# Patient Record
Sex: Female | Born: 1935 | Race: White | Hispanic: No
Health system: Southern US, Community
[De-identification: ages and names within clinical notes are randomized; demographics above are authoritative.]

## PROBLEM LIST (undated history)

## (undated) DIAGNOSIS — E7800 Pure hypercholesterolemia, unspecified: Secondary | ICD-10-CM

## (undated) DIAGNOSIS — E079 Disorder of thyroid, unspecified: Secondary | ICD-10-CM

## (undated) DIAGNOSIS — I10 Essential (primary) hypertension: Secondary | ICD-10-CM

## (undated) HISTORY — DX: Disorder of thyroid, unspecified: E07.9

## (undated) HISTORY — PX: PACEMAKER INSERTION: SHX728

## (undated) HISTORY — DX: Pure hypercholesterolemia, unspecified: E78.00

## (undated) HISTORY — DX: Essential (primary) hypertension: I10

---

## 2021-02-12 ENCOUNTER — Emergency Department (HOSPITAL_BASED_OUTPATIENT_CLINIC_OR_DEPARTMENT_OTHER)
Admission: EM | Admit: 2021-02-12 | Discharge: 2021-02-12 | Disposition: A | Discharge status: Home / Self Care | Payer: Medicare Other | Attending: Emergency Medicine | Admitting: Emergency Medicine

## 2021-02-12 ENCOUNTER — Encounter (HOSPITAL_BASED_OUTPATIENT_CLINIC_OR_DEPARTMENT_OTHER): Payer: Self-pay

## 2021-02-12 ENCOUNTER — Emergency Department (HOSPITAL_BASED_OUTPATIENT_CLINIC_OR_DEPARTMENT_OTHER): Payer: Medicare Other

## 2021-02-12 DIAGNOSIS — S42034A Nondisplaced fracture of lateral end of right clavicle, initial encounter for closed fracture: Secondary | ICD-10-CM | POA: Insufficient documentation

## 2021-02-12 DIAGNOSIS — I10 Essential (primary) hypertension: Secondary | ICD-10-CM | POA: Insufficient documentation

## 2021-02-12 DIAGNOSIS — Y92194 Driveway of other specified residential institution as the place of occurrence of the external cause: Secondary | ICD-10-CM | POA: Insufficient documentation

## 2021-02-12 DIAGNOSIS — Y9301 Activity, walking, marching and hiking: Secondary | ICD-10-CM | POA: Diagnosis not present

## 2021-02-12 DIAGNOSIS — S0083XA Contusion of other part of head, initial encounter: Secondary | ICD-10-CM | POA: Insufficient documentation

## 2021-02-12 DIAGNOSIS — S0011XA Contusion of right eyelid and periocular area, initial encounter: Secondary | ICD-10-CM

## 2021-02-12 DIAGNOSIS — Z79899 Other long term (current) drug therapy: Secondary | ICD-10-CM | POA: Insufficient documentation

## 2021-02-12 DIAGNOSIS — W01198A Fall on same level from slipping, tripping and stumbling with subsequent striking against other object, initial encounter: Secondary | ICD-10-CM | POA: Diagnosis not present

## 2021-02-12 DIAGNOSIS — S4991XA Unspecified injury of right shoulder and upper arm, initial encounter: Secondary | ICD-10-CM | POA: Diagnosis present

## 2021-02-12 DIAGNOSIS — W19XXXA Unspecified fall, initial encounter: Secondary | ICD-10-CM

## 2021-02-12 NOTE — Discharge Instructions (Addendum)
You can continue using Tylenol as needed at home for pain.  He can also apply ice packs 10 minutes at a time under shoulder and head for the bruising.  You can put aloe cream on your forehead wound.  Try to avoid a lot of direct sunlight or ultraviolet light until the wound is healed.  This will very likely still form a scar, but avoiding sunlight can help reduce the scar.  The bruising around your eye and forehead may take 2 weeks to go away

## 2021-02-12 NOTE — ED Notes (Signed)
Patient transported to CT 

## 2021-02-12 NOTE — ED Notes (Signed)
Discharge instructions reviewed with patient and daughter. Instructed on follow up information and how to put on sling. No further questions at this time. Unable to sign pad d/t malfunction of equipment. Verbalized understanding.

## 2021-02-12 NOTE — ED Provider Notes (Signed)
[facility] EMERGENCY DEPARTMENT Provider Note   CSN: [id number] Arrival date & time: 02/12/21  4098     History Chief Complaint  Patient presents with   Marletta Lor    Lindsey Orr is a 87 y.o. female presenting to the ED with a fall and injury to the right side of her body.  She reports that she was bending over to pick up a flower today on the driveway, and lost her balance and fell onto her right side.  She does not recall what exactly she hit her head on.  She does have an abrasion to the right forehead, as well as swelling and bruising around her right eye.  She also had pain in her right shoulder.  She denies any hip pain or injuries anywhere else.  She is not on blood thinners.  She is present with her daughter at bedside.  HPI     Past Medical History:  Diagnosis Date   High cholesterol    Hypertension    Thyroid disease     There are no problems to display for this patient.    OB History   No obstetric history on file.     History reviewed. No pertinent family history.     Home Medications Prior to Admission medications   Medication Sig Start Date End Date Taking? Authorizing Provider  EPINEPHrine (EPIPEN JR) 0.15 MG/0.3ML injection INJECT 0.3ML INTO THE MUSCLE ONCE FOR 1 DOSE. 05/12/15  Yes [provider]  albuterol (VENTOLIN HFA) 108 (90 Base) MCG/ACT inhaler Inhale 2 puffs into the lungs every 4 (four) hours as needed. 08/24/20   [provider]  amLODipine (NORVASC) 2.5 MG tablet Take 2.5 mg by mouth daily. 02/04/21   [provider]  ascorbic acid (VITAMIN C) 1000 MG tablet Take by mouth.    [provider]  famotidine (PEPCID) 40 MG tablet Take 40 mg by mouth daily. 02/03/21   [provider]  fexofenadine (ALLEGRA) 180 MG tablet Take by mouth.    [provider]  levothyroxine (SYNTHROID) 88 MCG tablet Take 88 mcg by mouth daily. 12/13/20   [provider]  Multiple Vitamin (MULTI-VITAMIN)  tablet Take by mouth.    [provider]  simvastatin (ZOCOR) 20 MG tablet Take 20 mg by mouth at bedtime. 12/13/20   [provider]    Allergies    Fish allergy, Shellfish allergy, Apixaban, and Rivaroxaban  Review of Systems   Review of Systems  Constitutional:  Negative for chills and fever.  Eyes:  Negative for photophobia, pain and visual disturbance.  Respiratory:  Negative for cough and shortness of breath.   Cardiovascular:  Negative for chest pain and palpitations.  Gastrointestinal:  Negative for abdominal pain and vomiting.  Genitourinary:  Negative for dysuria and hematuria.  Musculoskeletal:  Positive for arthralgias and myalgias.  Skin:  Positive for rash and wound.  Neurological:  Positive for headaches. Negative for syncope.  All other systems reviewed and are negative.  Physical Exam Updated Vital Signs BP (!) 147/74   Pulse 64   Temp 98 F (36.7 C) (Oral)   Resp 20   Ht 5\' 5"  (1.651 m)   Wt 78.2 kg   SpO2 97%   BMI 28.71 kg/m   Physical Exam Constitutional:      General: She is not in acute distress. HENT:     Head: Normocephalic and atraumatic.     Comments: Large superficial abrasion to right forehead Right periorbital edema  and ecchymosis  Eyes:     Conjunctiva/sclera: Conjunctivae normal.     Pupils: Pupils are equal, round, and reactive to light.  Cardiovascular:     Rate and Rhythm: Normal rate and regular rhythm.  Pulmonary:     Effort: Pulmonary effort is normal. No respiratory distress.  Abdominal:     General: There is no distension.     Tenderness: There is no abdominal tenderness.  Musculoskeletal:     Comments: Ecchymosis of the right shoulder near humeral head, minimal tenderness No deformity of the arm or clavicle No other injuries noted on exam  Skin:    General: Skin is warm and dry.  Neurological:     General: No focal deficit present.     Mental Status: She is alert and oriented to person, place, and  time. Mental status is at baseline.  Psychiatric:        Mood and Affect: Mood normal.        Behavior: Behavior normal.    ED Results / Procedures / Treatments   Labs (all labs ordered are listed, but only abnormal results are displayed) Labs Reviewed - No data to display  EKG None  Radiology DG Shoulder Right  Result Date: 02/12/2021 CLINICAL DATA:  Fall. EXAM: RIGHT SHOULDER - 2+ VIEW COMPARISON:  12/29/2019. FINDINGS: Slightly displaced fracture of the distal aspect of the right clavicle. No evidence of dislocation or separation. Radiopacity noted over the right shoulder possibly loose body. Similar finding noted on prior study. Punctate pulmonary calcifications consistent with prior granulomas disease. IMPRESSION: Slightly displaced fracture of the distal aspect of the right clavicle. No evidence of dislocation or separation Electronically Signed   By: Maisie Fus  Register   On: 02/12/2021 11:07   CT Head Wo Contrast  Result Date: 02/12/2021 CLINICAL DATA:  Fall onto concrete, head injury EXAM: CT HEAD WITHOUT CONTRAST CT MAXILLOFACIAL WITHOUT CONTRAST TECHNIQUE: Multidetector CT imaging of the head and maxillofacial structures were performed using the standard protocol without intravenous contrast. Multiplanar CT image reconstructions of the maxillofacial structures were also generated. COMPARISON:  CT head January 2021 FINDINGS: CT HEAD FINDINGS Brain: There is no acute intracranial hemorrhage, mass effect, or edema. Gray-white differentiation is preserved. No extra-axial collection. Ventricles and sulci are within normal limits in size and configuration. Patchy low-attenuation in the supratentorial white matter is nonspecific but may reflect mild to moderate chronic microvascular ischemic changes. Vascular: There is intracranial atherosclerotic calcification at the skull base. Skull: Calvarium is intact. Other: Mastoid air cells are clear.  Probable right stapes implant. CT MAXILLOFACIAL  FINDINGS Osseous: No acute facial fracture. Degenerative changes of the right temporomandibular joint Orbits: No intraorbital hematoma. Sinuses: Paranasal sinus mucosal thickening. Soft tissues: Right anterior frontal scalp and periorbital soft tissue swelling. IMPRESSION: No evidence of acute intracranial injury.  No acute facial fracture. Electronically Signed   By: Guadlupe Spanish M.D.   On: 02/12/2021 11:10   CT Cervical Spine Wo Contrast  Result Date: 02/12/2021 CLINICAL DATA:  Fall, head injury EXAM: CT CERVICAL SPINE WITHOUT CONTRAST TECHNIQUE: Multidetector CT imaging of the cervical spine was performed without intravenous contrast. Multiplanar CT image reconstructions were also generated. COMPARISON:  None. FINDINGS: Alignment: Trace anterolisthesis at C4-C5. Skull base and vertebrae: No acute cervical spine fracture. Vertebral body heights are maintained. Soft tissues and spinal canal: No prevertebral fluid or swelling. No visible canal hematoma. Disc levels: Multilevel degenerative changes are present including disc space narrowing, endplate osteophytes, and facet and uncovertebral hypertrophy. There is  no high-grade osseous encroachment on the spinal canal. Upper chest: Included lung apices are clear. Other: Calcified plaque at the common carotid bifurcations. There is a 13 mm soft tissue lesion of the left parotid. IMPRESSION: No acute cervical spine fracture. 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 Consider tissue sampling as appropriate in this patient. Electronically Signed   By: Guadlupe SpanishPraneil  Patel M.D.   On: 02/12/2021 11:18   DG Humerus Right  Result Date: 02/12/2021 CLINICAL DATA:  Recent fall with right arm pain, initial encounter EXAM: RIGHT HUMERUS - 2+ VIEW COMPARISON:  None. FINDINGS: Mild degenerative changes of the acromioclavicular joint are seen. No definitive humeral fracture is seen. Underlying bony thorax appears within normal limits. Distal right  clavicular fracture is noted. IMPRESSION: Distal right clavicular fracture.  No acute abnormality is seen. Electronically Signed   By: Alcide CleverMark  Lukens M.D.   On: 02/12/2021 11:08   CT Maxillofacial Wo Contrast  Result Date: 02/12/2021 CLINICAL DATA:  Fall onto concrete, head injury EXAM: CT HEAD WITHOUT CONTRAST CT MAXILLOFACIAL WITHOUT CONTRAST TECHNIQUE: Multidetector CT imaging of the head and maxillofacial structures were performed using the standard protocol without intravenous contrast. Multiplanar CT image reconstructions of the maxillofacial structures were also generated. COMPARISON:  CT head January 2021 FINDINGS: CT HEAD FINDINGS Brain: There is no acute intracranial hemorrhage, mass effect, or edema. Gray-white differentiation is preserved. No extra-axial collection. Ventricles and sulci are within normal limits in size and configuration. Patchy low-attenuation in the supratentorial white matter is nonspecific but may reflect mild to moderate chronic microvascular ischemic changes. Vascular: There is intracranial atherosclerotic calcification at the skull base. Skull: Calvarium is intact. Other: Mastoid air cells are clear.  Probable right stapes implant. CT MAXILLOFACIAL FINDINGS Osseous: No acute facial fracture. Degenerative changes of the right temporomandibular joint Orbits: No intraorbital hematoma. Sinuses: Paranasal sinus mucosal thickening. Soft tissues: Right anterior frontal scalp and periorbital soft tissue swelling. IMPRESSION: No evidence of acute intracranial injury.  No acute facial fracture. Electronically Signed   By: Guadlupe SpanishPraneil  Patel M.D.   On: 02/12/2021 11:10    Procedures Procedures   Medications Ordered in ED Medications - No data to display  ED Course  I have reviewed the triage vital signs and the nursing notes.  Pertinent labs & imaging results that were available during my care of the patient were reviewed by me and considered in my medical decision making (see chart  for details).  This is an 87 year old female presenting with a mechanical fall at home and bruising and injuries to the right side of the body.  CT scans of been ordered of the head, facial bone scan, C-spine.  Also get x-rays of the right shoulder.  No other injuries are noted on exam.  She is otherwise well-appearing.  Family is at the bedside to provide supplemental history.  Trauma images reviewed.  No acute intracranial injury, C-spine fracture, or facial fracture.  She does appear to have a right clavicle fracture.  This is a closed fracture.  There is no tenting of the skin.  She is neurovascularly intact.  We will place her in a sling and have her follow-up with orthopedics in 2 to 3 weeks.  She and her daughter verbalized understanding.     Final Clinical Impression(s) / ED Diagnoses Final diagnoses:  Closed nondisplaced fracture of acromial end of right clavicle, initial encounter  Contusion of face, initial encounter  Fall, initial encounter  Periorbital ecchymosis of right eye,  initial encounter    Rx / DC Orders ED Discharge Orders     None        Azara Gemme, Kermit Balo, MD 02/12/21 1428

## 2021-02-12 NOTE — ED Triage Notes (Signed)
Pt fell yesterday walking up driveway, fell on concrete steps. Bruising/swelling to right forehead and eye as well as right shoulder. Denies blood thinners.

## 2021-02-27 ENCOUNTER — Ambulatory Visit: Payer: Medicare Other | Admitting: Orthopaedic Surgery

## 2021-02-28 ENCOUNTER — Ambulatory Visit: Payer: Self-pay

## 2021-02-28 ENCOUNTER — Other Ambulatory Visit: Payer: Self-pay

## 2021-02-28 ENCOUNTER — Ambulatory Visit: Payer: Medicare Other | Admitting: Orthopaedic Surgery

## 2021-02-28 DIAGNOSIS — S42034A Nondisplaced fracture of lateral end of right clavicle, initial encounter for closed fracture: Secondary | ICD-10-CM | POA: Diagnosis not present

## 2021-02-28 DIAGNOSIS — M79601 Pain in right arm: Secondary | ICD-10-CM | POA: Diagnosis not present

## 2021-02-28 NOTE — Progress Notes (Signed)
The patient is a very pleasant 87 year old female who sustained a mechanical fall 2 weeks ago injuring her right shoulder.  She sustained a lateral clavicle fracture and was placed appropriately in a sling in the emergency room.  She is seeing [place] today as routine follow-up.  She reports minimal pain.  She is been compliant with wearing her sling and coming in and out of it for hygiene purposes.  Her daughter is with her said there was a lot of bruising around the clavicle for the first week but that is getting significantly less.  She denies any neck pain denies any numbness and tingling in her hands.  She is had no other acute change in her medical status.  We are able to review all of her chart through epic in terms of medications as well as past medical history.  There is been no other issues related to her fall.  On examination of her right upper extremity she is able to abduct her shoulder easily and reach above her head with minimal discomfort.  There is no soft tissue compromise when assessing the clavicle.  There is some bruising and slight pain around the lateral clavicle.  X-rays of the right clavicle show a minimally displaced lateral clavicle fracture.  The relationship of the [facility] joint and the scapula with the clavicle is normal.  The shoulder is well located.  At this point since it been 2 weeks and she is comfortable she can stop wearing the sling.  There are really no restrictions for her and she will let her pain be the guide.  They know to avoid heavy lifting until she is more comfortable which she does not do anyway in terms of lifting.  Follow-up really can be as needed since she is doing so well unless there are issues.  I talked to the daughter in length about this and they agreed that follow-up should be as needed unless there are issues.  All questions and concerns were answered and addressed.

## 2023-01-24 IMAGING — CT CT HEAD W/O CM
3 series · 15 of 47 positions shown, 18 images · non-contrast
Comparison: CT head August 2019

CLINICAL DATA: Fall onto concrete, head injury

EXAM:
CT HEAD WITHOUT CONTRAST
CT MAXILLOFACIAL WITHOUT CONTRAST
TECHNIQUE: Multidetector CT imaging of the head and maxillofacial structures
were performed using the standard protocol without intravenous
contrast. Multiplanar CT image reconstructions of the maxillofacial
structures were also generated.

[Series 2: head wo · axial · 0.45mm/px · z∈[-163,-33]mm · 9 of 32 slices shown, 12 images]
[im 3/32  brain]
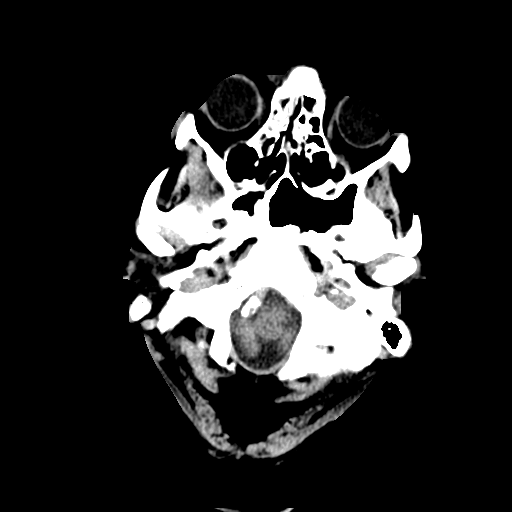
[im 3/32  bone]
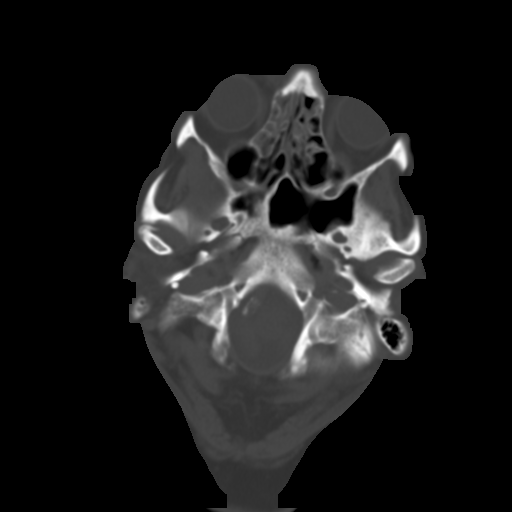
[im 6/32  brain]
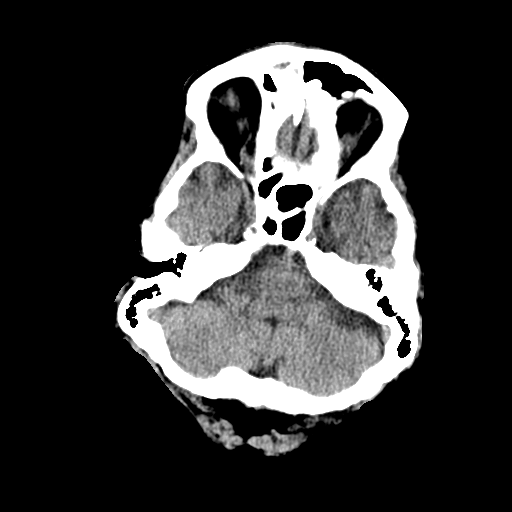
[im 9/32  brain]
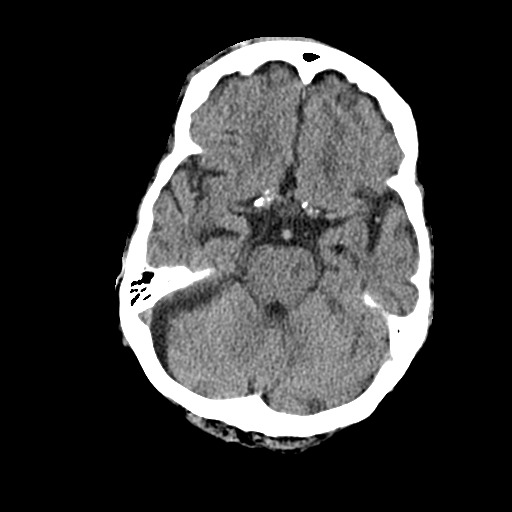
[im 12/32  brain]
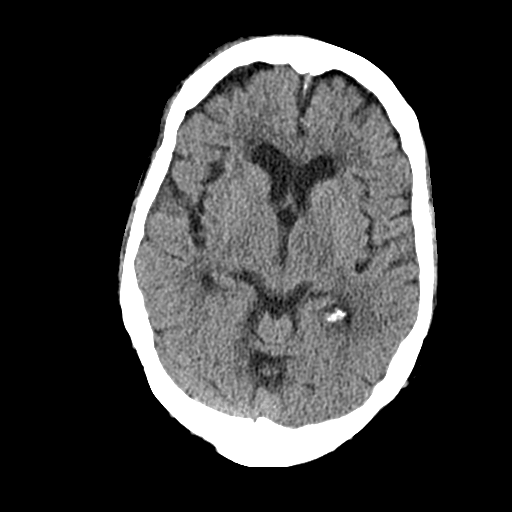
[im 17/32  brain]
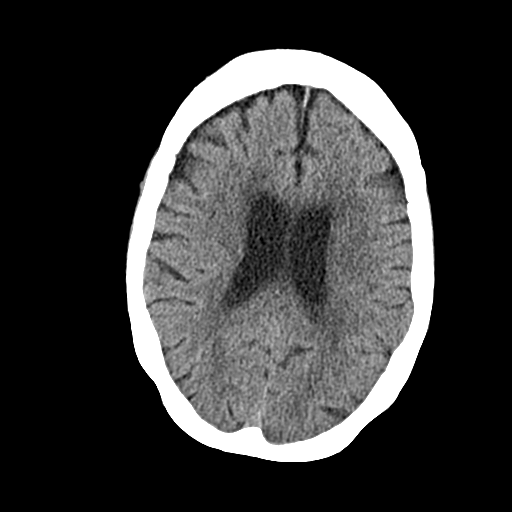
[im 17/32  bone]
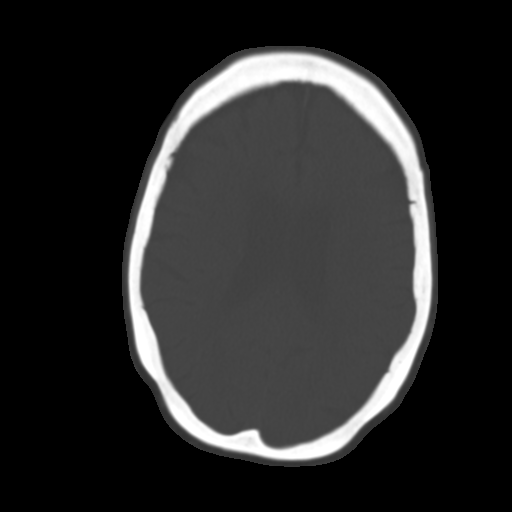
[im 20/32  brain]
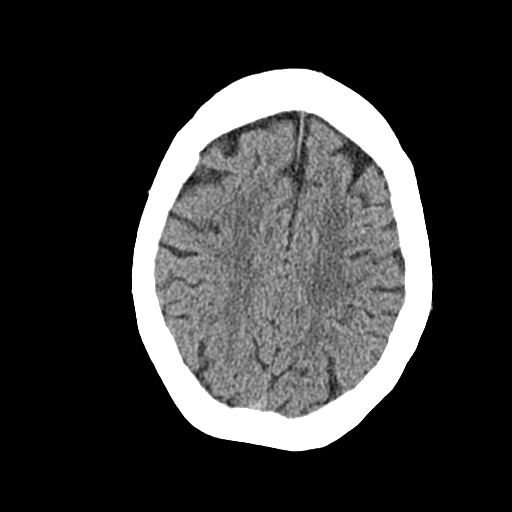
[im 23/32  brain]
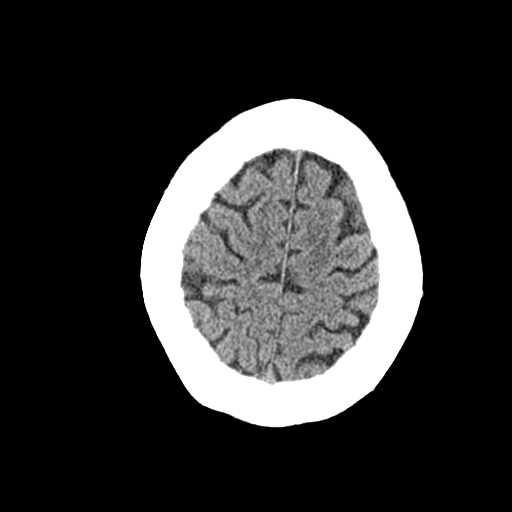
[im 26/32  brain]
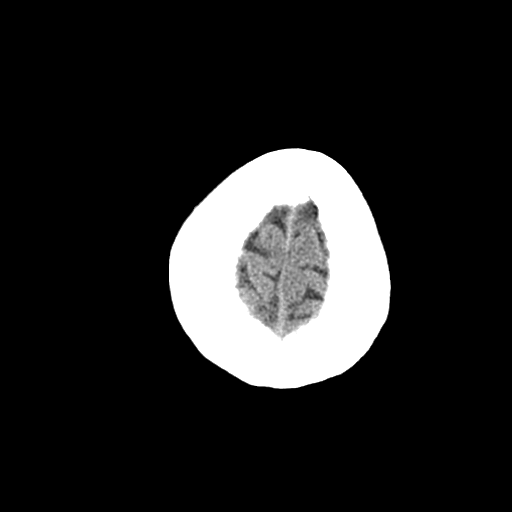
[im 29/32  brain]
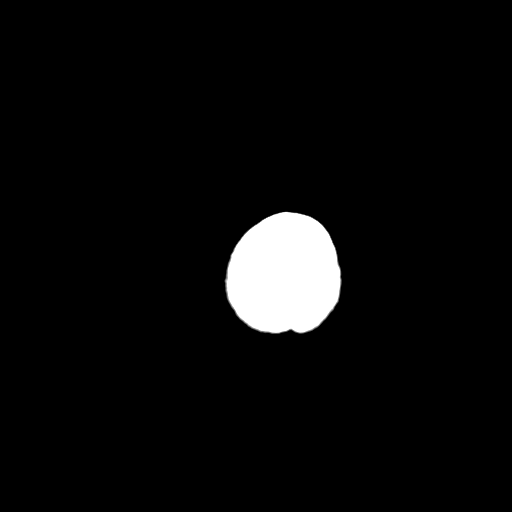
[im 29/32  bone]
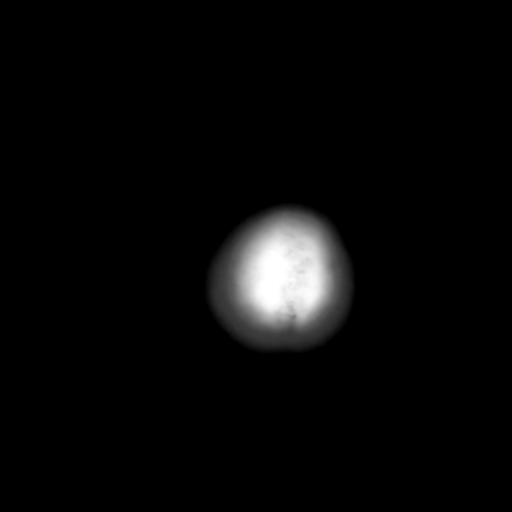

[Series 4: head coronal · coronal · 0.33mm/px · 3 of 69 slices shown]
[im 23/69  brain]
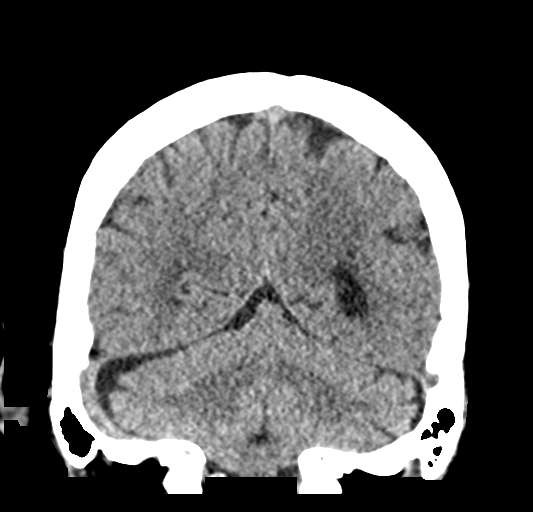
[im 31/69  brain]
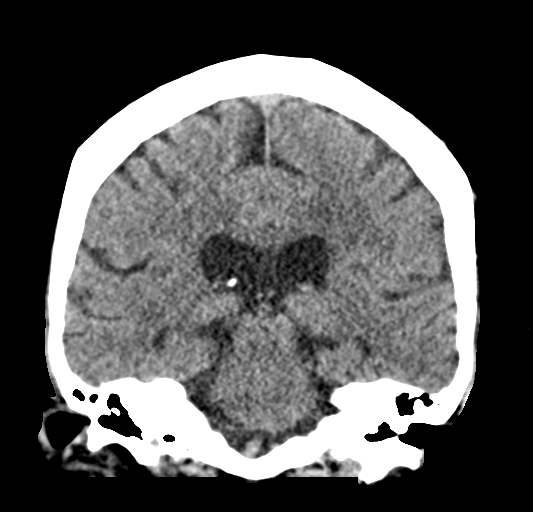
[im 38/69  brain]
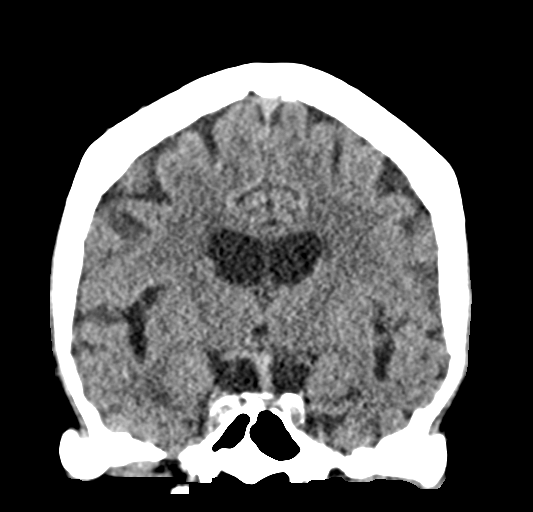

[Series 5: head sagittal · sagittal · 0.31mm/px · 3 of 56 slices shown]
[im 19/56  brain]
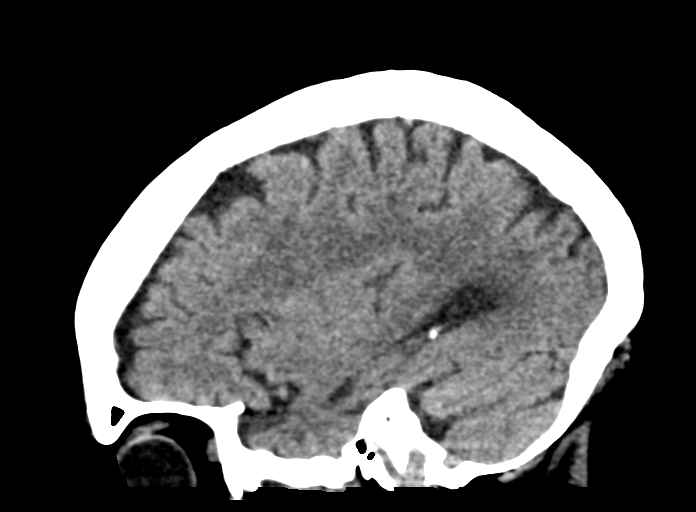
[im 28/56  brain]
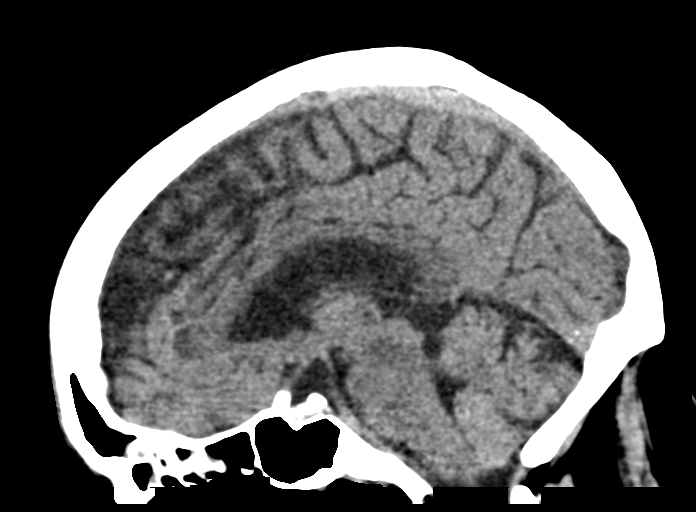
[im 37/56  brain]
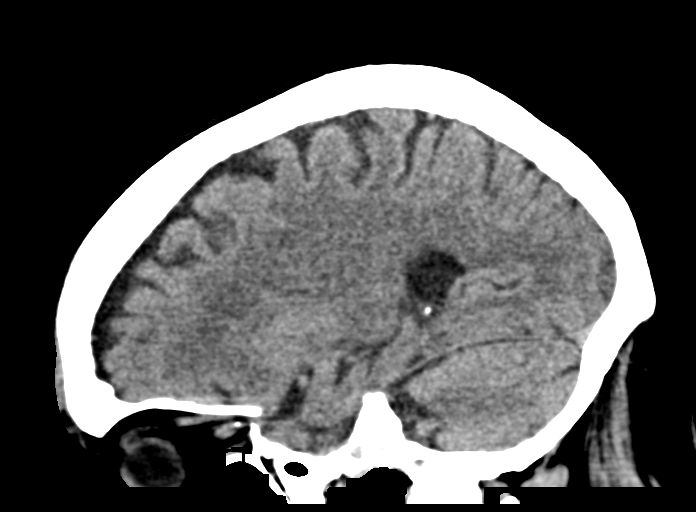

[15 of 47 positions shown; findings below may reference images not displayed]

FINDINGS: CT HEAD FINDINGS

Brain: There is no acute intracranial hemorrhage, mass effect, or
edema. Gray-white differentiation is preserved. No extra-axial
collection. Ventricles and sulci are within normal limits in size
and configuration. Patchy low-attenuation in the supratentorial
white matter is nonspecific but may reflect mild to moderate chronic
microvascular ischemic changes.

Vascular: There is intracranial atherosclerotic calcification at the
skull base.

Skull: Calvarium is intact.

Other: Mastoid air cells are clear.  Probable right stapes implant.

CT MAXILLOFACIAL FINDINGS

Osseous: No acute facial fracture. Degenerative changes of the right
temporomandibular joint

Orbits: No intraorbital hematoma.

Sinuses: Paranasal sinus mucosal thickening.

Soft tissues: Right anterior frontal scalp and periorbital soft
tissue swelling.
IMPRESSION: No evidence of acute intracranial injury.  No acute facial fracture.
